# Patient Record
Sex: Female | Born: 1995 | Race: White | Hispanic: No | Marital: Single
Health system: Southern US, Community
[De-identification: ages and names within clinical notes are randomized; demographics above are authoritative.]

---

## 2018-02-01 ENCOUNTER — Emergency Department (HOSPITAL_COMMUNITY)
Admission: EM | Admit: 2018-02-01 | Discharge: 2018-02-01 | Disposition: A | Discharge status: Home / Self Care | Payer: Managed Care, Other (non HMO) | Attending: Emergency Medicine | Admitting: Emergency Medicine

## 2018-02-01 ENCOUNTER — Emergency Department (HOSPITAL_COMMUNITY): Payer: Managed Care, Other (non HMO)

## 2018-02-01 ENCOUNTER — Other Ambulatory Visit: Payer: Self-pay

## 2018-02-01 DIAGNOSIS — N760 Acute vaginitis: Secondary | ICD-10-CM | POA: Insufficient documentation

## 2018-02-01 DIAGNOSIS — N939 Abnormal uterine and vaginal bleeding, unspecified: Secondary | ICD-10-CM

## 2018-02-01 DIAGNOSIS — R102 Pelvic and perineal pain: Secondary | ICD-10-CM

## 2018-02-01 DIAGNOSIS — B9689 Other specified bacterial agents as the cause of diseases classified elsewhere: Secondary | ICD-10-CM

## 2018-02-01 LAB — URINALYSIS, ROUTINE W REFLEX MICROSCOPIC
Bilirubin Urine: NEGATIVE
GLUCOSE, UA: NEGATIVE mg/dL
HGB URINE DIPSTICK: NEGATIVE
Ketones, ur: NEGATIVE mg/dL
Leukocytes, UA: NEGATIVE
Nitrite: NEGATIVE
PROTEIN: NEGATIVE mg/dL
Specific Gravity, Urine: 1.023 (ref 1.005–1.030)
pH: 5 (ref 5.0–8.0)

## 2018-02-01 LAB — I-STAT BETA HCG BLOOD, ED (MC, WL, AP ONLY): I-stat hCG, quantitative: <5 m[IU]/mL (ref <5)

## 2018-02-01 LAB — GC/CHLAMYDIA PROBE AMP (~~LOC~~) NOT AT ARMC
Chlamydia: NEGATIVE
NEISSERIA GONORRHEA: NEGATIVE

## 2018-02-01 LAB — WET PREP, GENITAL
SPERM: NONE SEEN
Trich, Wet Prep: NONE SEEN
Yeast Wet Prep HPF POC: NONE SEEN

## 2018-02-01 MED ORDER — METRONIDAZOLE 500 MG PO TABS: 500.0000 mg | ORAL_TABLET | Freq: Two times a day (BID) | ORAL | 0 refills | Status: AC

## 2018-02-01 NOTE — ED Triage Notes (Signed)
Patient with vaginal bleeding during sex.  She is having pain during sex and not during sex.  She states that she is visiting, is from [place] and she called her GYN who said to come here and get an ultrasound.  Patient with history of endometriosis.

## 2018-02-01 NOTE — Discharge Instructions (Addendum)
Take the prescribed medication as directed.  Do NOT drink alcohol while taking this medication, it will make you sick. Follow-up with the women's clinic if you have ongoing issues. Return to the ED for new or worsening symptoms.

## 2018-02-01 NOTE — ED Provider Notes (Signed)
[facility] EMERGENCY DEPARTMENT Provider Note   CSN: [id number] Arrival date & time: 02/01/18  0141     History   Chief Complaint Chief Complaint  Patient presents with  . Vaginal Bleeding    HPI Sara Hicks is a 22 y.o. female.  The history is provided by the patient and medical records.     22 year old female presenting to the ED with painful intercourse and vaginal bleeding.  States this is been an ongoing issue for the past several months.  She is currently in [place] living with her boyfriend for a few months, her OB/GYN is in [place].  States any time she has intercourse she has a lot of pain all the way across her pelvis and has some vaginal spotting.  States usually this resolves after a while.  States pain has become more intense and bleeding is lasting longer.  She has not had any new discharge.  She has chronic UTI symptoms, has been followed by urology in the past.  She does have history of vesicular reflux in childhood.  She does take ciprofloxacin after intercourse to help prevent UTIs.  States she has never had a Pap smear.  States today she had intercourse with her boyfriend and pain got significantly more intense.  She called her OB/GYN and told her to come in for a ultrasound.  She does have family history of endometriosis but has never had evaluation for this.  No past medical history on file.  There are no active problems to display for this patient.   OB History   None      Home Medications    Prior to Admission medications   Not on File    Family History No family history on file.  Social History Social History   Tobacco Use  . Smoking status: Not on file  Substance Use Topics  . Alcohol use: Not on file  . Drug use: Not on file     Allergies   Nitrofuran derivatives   Review of Systems Review of Systems  Genitourinary: Positive for vaginal bleeding and vaginal pain.  All other systems  reviewed and are negative.    Physical Exam Updated Vital Signs BP 114/82   Pulse 91   Temp 99.5 F (37.5 C) (Oral)   Resp 16   SpO2 97%   Physical Exam  Constitutional: She is oriented to person, place, and time. She appears well-developed and well-nourished.  HENT:  Head: Normocephalic and atraumatic.  Mouth/Throat: Oropharynx is clear and moist.  Eyes: Pupils are equal, round, and reactive to light. Conjunctivae and EOM are normal.  Neck: Normal range of motion.  Cardiovascular: Normal rate, regular rhythm and normal heart sounds.  Pulmonary/Chest: Effort normal and breath sounds normal. No stridor. No respiratory distress.  Abdominal: Soft. Bowel sounds are normal. There is no tenderness. There is no rebound.  Genitourinary:  Genitourinary Comments: Exam chaperoned by RN Normal female external genitalia without visible lesions or rash; mild amount of thin, white mucous appearing discharge; some mild vaginal wall irritation noted without bleeding; no lesions noted; no adnexal or CMT  Musculoskeletal: Normal range of motion.  Neurological: She is alert and oriented to person, place, and time.  Skin: Skin is warm and dry.  Psychiatric: She has a normal mood and affect.  Nursing note and vitals reviewed.    ED Treatments / Results  Labs (all labs ordered are listed, but only abnormal results are displayed) Labs Reviewed  WET  PREP, GENITAL - Abnormal; Notable for the following components:      Result Value   Clue Cells Wet Prep HPF POC PRESENT (*)    WBC, Wet Prep HPF POC MODERATE (*)    All other components within normal limits  URINALYSIS, ROUTINE W REFLEX MICROSCOPIC - Abnormal; Notable for the following components:   APPearance HAZY (*)    All other components within normal limits  URINE CULTURE  I-STAT BETA HCG BLOOD, ED ([facility], [facility], [facility] ONLY)  GC/CHLAMYDIA PROBE AMP ([place]) NOT AT [facility]    EKG None  Radiology [place] Pelvis Transvanginal Non-ob (tv  Only)  Result Date: 02/01/2018 CLINICAL DATA:  22 year old female with pelvic vein. EXAM: TRANSABDOMINAL AND TRANSVAGINAL ULTRASOUND OF PELVIS DOPPLER ULTRASOUND OF OVARIES TECHNIQUE: Both transabdominal and transvaginal ultrasound examinations of the pelvis were performed. Transabdominal technique was performed for global imaging of the pelvis including uterus, ovaries, adnexal regions, and pelvic cul-de-sac. It was necessary to proceed with endovaginal exam following the transabdominal exam to visualize the endometrium and ovaries. Color and duplex Doppler ultrasound was utilized to evaluate blood flow to the ovaries. COMPARISON:  None. FINDINGS: Uterus Measurements: 5.6 x 3.8 x 5.1 cm. No fibroids or other mass visualized. Endometrium Thickness: 6 mm.  No focal abnormality visualized. Right ovary Measurements: 3.3 x 2.1 x 1.9 cm. Normal appearance/no adnexal mass. Left ovary Measurements: 2.8 x 1.9 x 2.2 cm. Normal appearance/no adnexal mass. Pulsed Doppler evaluation of both ovaries demonstrates normal low-resistance arterial and venous waveforms. Other findings Small free fluid within pelvis. IMPRESSION: Unremarkable pelvic ultrasound. Doppler detected arterial and venous flow to both ovaries. Electronically Signed   By: Elgie CollardArash  Radparvar M.D.   On: 02/01/2018 05:53   [place] Pelvis Complete  Result Date: 02/01/2018 CLINICAL DATA:  22 year old female with pelvic vein. EXAM: TRANSABDOMINAL AND TRANSVAGINAL ULTRASOUND OF PELVIS DOPPLER ULTRASOUND OF OVARIES TECHNIQUE: Both transabdominal and transvaginal ultrasound examinations of the pelvis were performed. Transabdominal technique was performed for global imaging of the pelvis including uterus, ovaries, adnexal regions, and pelvic cul-de-sac. It was necessary to proceed with endovaginal exam following the transabdominal exam to visualize the endometrium and ovaries. Color and duplex Doppler ultrasound was utilized to evaluate blood flow to the ovaries.  COMPARISON:  None. FINDINGS: Uterus Measurements: 5.6 x 3.8 x 5.1 cm. No fibroids or other mass visualized. Endometrium Thickness: 6 mm.  No focal abnormality visualized. Right ovary Measurements: 3.3 x 2.1 x 1.9 cm. Normal appearance/no adnexal mass. Left ovary Measurements: 2.8 x 1.9 x 2.2 cm. Normal appearance/no adnexal mass. Pulsed Doppler evaluation of both ovaries demonstrates normal low-resistance arterial and venous waveforms. Other findings Small free fluid within pelvis. IMPRESSION: Unremarkable pelvic ultrasound. Doppler detected arterial and venous flow to both ovaries. Electronically Signed   By: Elgie CollardArash  Radparvar M.D.   On: 02/01/2018 05:53   [place] Pelvic Doppler (torsion R/o Or Mass Arterial Flow)  Result Date: 02/01/2018 CLINICAL DATA:  22 year old female with pelvic vein. EXAM: TRANSABDOMINAL AND TRANSVAGINAL ULTRASOUND OF PELVIS DOPPLER ULTRASOUND OF OVARIES TECHNIQUE: Both transabdominal and transvaginal ultrasound examinations of the pelvis were performed. Transabdominal technique was performed for global imaging of the pelvis including uterus, ovaries, adnexal regions, and pelvic cul-de-sac. It was necessary to proceed with endovaginal exam following the transabdominal exam to visualize the endometrium and ovaries. Color and duplex Doppler ultrasound was utilized to evaluate blood flow to the ovaries. COMPARISON:  None. FINDINGS: Uterus Measurements: 5.6 x 3.8 x 5.1 cm. No fibroids or other mass visualized. Endometrium Thickness:  6 mm.  No focal abnormality visualized. Right ovary Measurements: 3.3 x 2.1 x 1.9 cm. Normal appearance/no adnexal mass. Left ovary Measurements: 2.8 x 1.9 x 2.2 cm. Normal appearance/no adnexal mass. Pulsed Doppler evaluation of both ovaries demonstrates normal low-resistance arterial and venous waveforms. Other findings Small free fluid within pelvis. IMPRESSION: Unremarkable pelvic ultrasound. Doppler detected arterial and venous flow to both ovaries. Electronically  Signed   By: Elgie Collard M.D.   On: 02/01/2018 05:53    Procedures Procedures (including critical care time)  Medications Ordered in ED Medications - No data to display   Initial Impression / Assessment and Plan / ED Course  I have reviewed the triage vital signs and the nursing notes.  Pertinent labs & imaging results that were available during my care of the patient were reviewed by me and considered in my medical decision making (see chart for details).  22 year old female presenting to the ED with pelvic pain and vaginal spotting during intercourse.  This is been ongoing for a few months now.  She is in [place] visiting her boyfriend for a few months, OB/GYN that she sees in [place].  Was told to come in today for ultrasound.  She is afebrile and nontoxic.  Her abdomen is soft and benign.  Pelvic exam performed, some mucousy appearing discharge and vaginal wall appears irritated but there is no active bleeding.  No visible lesions or rash.  No adnexal or cervical motion tenderness.  Ultrasound was obtained and is negative for any acute findings.  Wet prep does reveal clue cells.  Gonorrhea and Chlamydia cultures pending.  Patient will be started on Flagyl-- advised to avoid EtOH while taking this.  She was given follow-up with the women's clinic if any ongoing issues while here in [place], otherwise understands to follow-up with her OB/GYN once returning home.  Discussed plan with patient, she acknowledged understanding and agreed with plan of care.  Return precautions given for new or worsening symptoms.  Final Clinical Impressions(s) / ED Diagnoses   Final diagnoses:  Pelvic pain  Vaginal spotting  BV (bacterial vaginosis)    ED Discharge Orders        Ordered    metroNIDAZOLE (FLAGYL) 500 MG tablet  2 times daily     02/01/18 0647       Garlon Hatchet, PA-C 02/01/18 2956    Gilda Crease, MD 02/01/18 [phone]

## 2018-02-01 NOTE — ED Notes (Signed)
ED Provider at bedside. 

## 2018-02-01 NOTE — ED Notes (Signed)
Discharge instructions/prescriptions reviewed with patient. Signature pad unavailable at this time. Pt verbalized understanding.

## 2018-02-03 LAB — URINE CULTURE

## 2018-02-04 ENCOUNTER — Telehealth (HOSPITAL_COMMUNITY): Payer: Self-pay | Admitting: Pharmacist

## 2018-02-04 NOTE — Progress Notes (Signed)
ED Antimicrobial Stewardship Positive Culture Follow Up   Sara Hicks is an 22 y.o. female who presented to [facility]   Recent Results (from the past 720 hour(s))  Wet prep, genital     Status: Abnormal   Collection Time: 02/01/18  3:57 AM  Result Value Ref Range Status   Yeast Wet Prep HPF POC NONE SEEN NONE SEEN Final   Trich, Wet Prep NONE SEEN NONE SEEN Final   Clue Cells Wet Prep HPF POC PRESENT (A) NONE SEEN Final   WBC, Wet Prep HPF POC MODERATE (A) NONE SEEN Final   Sperm NONE SEEN  Final    Comment: Performed at [facility], [address] [place], [place] [address]  Urine culture     Status: Abnormal   Collection Time: 02/01/18  3:57 AM  Result Value Ref Range Status   Specimen Description URINE, CLEAN CATCH  Final   Special Requests   Final    NONE Performed at [facility], [address] [place], [place] [address]    Culture >=100,000 COLONIES/mL ENTEROCOCCUS FAECALIS (A)  Final   Report Status 02/03/2018 FINAL  Final   Organism ID, Bacteria ENTEROCOCCUS FAECALIS (A)  Final      Susceptibility   Enterococcus faecalis - MIC*    AMPICILLIN <=2 SENSITIVE Sensitive     LEVOFLOXACIN >=8 RESISTANT Resistant     NITROFURANTOIN <=16 SENSITIVE Sensitive     VANCOMYCIN 1 SENSITIVE Sensitive     * >=100,000 COLONIES/mL ENTEROCOCCUS FAECALIS    []  Treated with, organism resistant to prescribed antimicrobial [x]  Patient discharged originally without antimicrobial agent and treatment is now indicated  New antibiotic prescription: Amoxicillin 500 mg po tid x 7days  ED Provider: K. Ala DachFord, PA-C   MastersJill Side, Emilia Kayes M 02/04/2018, 9:27 AM

## 2018-02-04 NOTE — Telephone Encounter (Signed)
Post ED Visit - Positive Culture Follow-up: Successful Patient Follow-Up  Culture assessed and recommendations reviewed by: []  Enzo BiNathan Batchelder, Pharm.D. []  Celedonio MiyamotoJeremy Frens, [address] BCPS AQ-ID []  Garvin FilaMike Maccia, Pharm.D., BCPS []  Georgina PillionElizabeth Martin, Pharm.D., BCPS []  MiloMinh Pham, [address] BCPS, AAHIVP []  Estella HuskMichelle Turner, Pharm.D., BCPS, AAHIVP []  Lysle Pearlachel Rumbarger, PharmD, BCPS []  Sherlynn CarbonAustin Lucas, PharmD []  Pollyann SamplesAndy Johnston, PharmD, BCPS A. Masters Pharm D Positive urine culture  [x]  Patient discharged without antimicrobial prescription and treatment is now indicated []  Organism is resistant to prescribed ED discharge antimicrobial []  Patient with positive blood cultures  Changes discussed with ED provider: Boykin ReaperKelsely Ford [employer] New antibiotic prescription Amoxicillin 500 PO TIC x 7 days [facility] [address] [phone]  Contacted patient, date 02/04/18, time 0950   Jerry CarasCullom, Ernestene Coover Burnett 02/04/2018, 9:49 AM

## 2019-01-03 IMAGING — US US TRANSVAGINAL NON-OB
1 series · 13 of 25 positions shown · non-contrast
Comparison: None.

CLINICAL DATA: 21-year-old female with pelvic vein.

EXAM:
TRANSABDOMINAL AND TRANSVAGINAL ULTRASOUND OF PELVIS
DOPPLER ULTRASOUND OF OVARIES
TECHNIQUE: Both transabdominal and transvaginal ultrasound examinations of the
pelvis were performed. Transabdominal technique was performed for
global imaging of the pelvis including uterus, ovaries, adnexal
regions, and pelvic cul-de-sac.
It was necessary to proceed with endovaginal exam following the
transabdominal exam to visualize the endometrium and ovaries. Color
and duplex Doppler ultrasound was utilized to evaluate blood flow to
the ovaries.

[Series 1: us transvaginal non-ob · 0.24mm/px · 60 acquisitions, 13 frames shown]
[im 1/60]
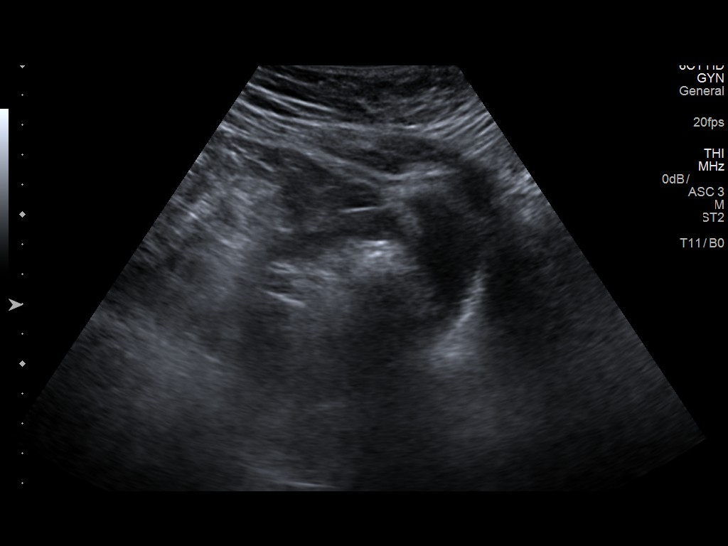
[im 5/60]
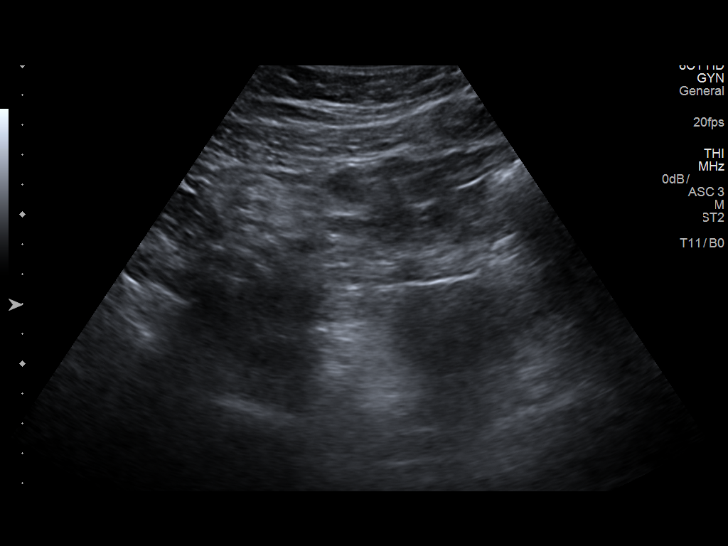
[im 10/60]
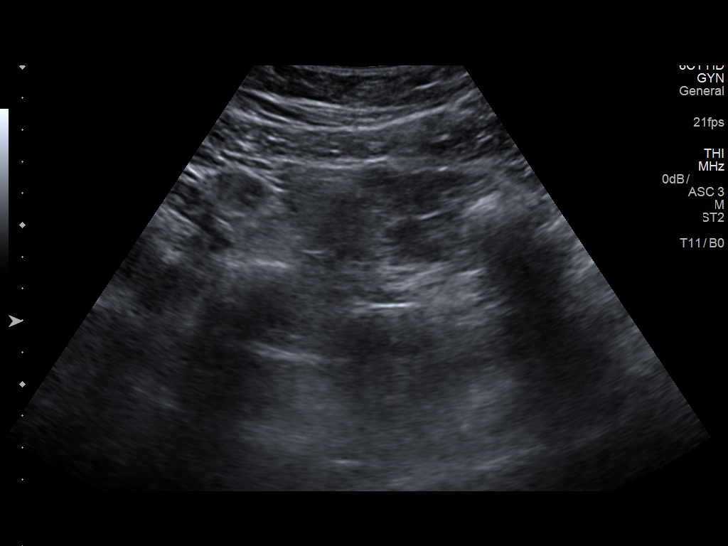
[im 15/60]
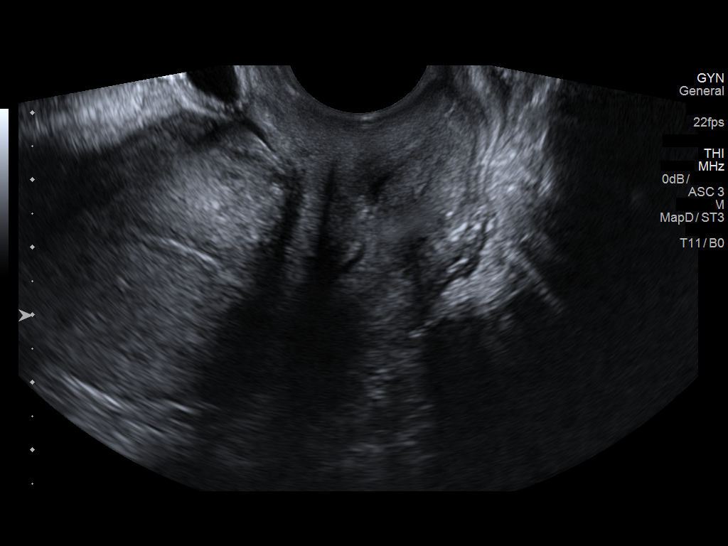
[im 20/60]
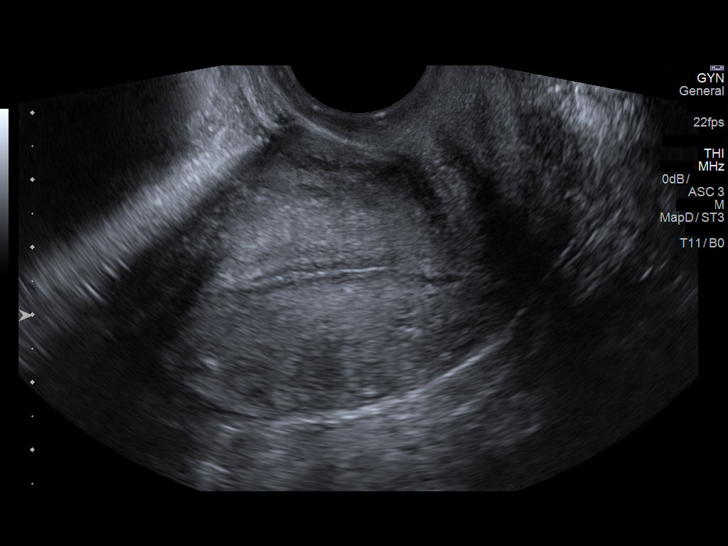
[im 25/60]
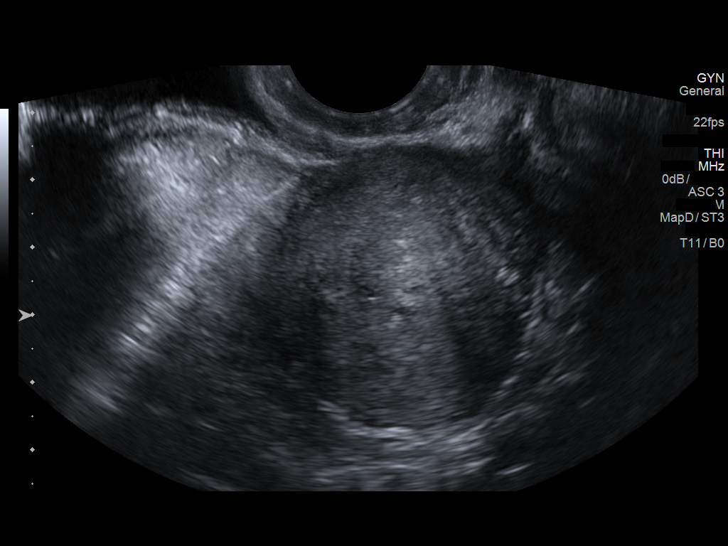
[im 30/60]
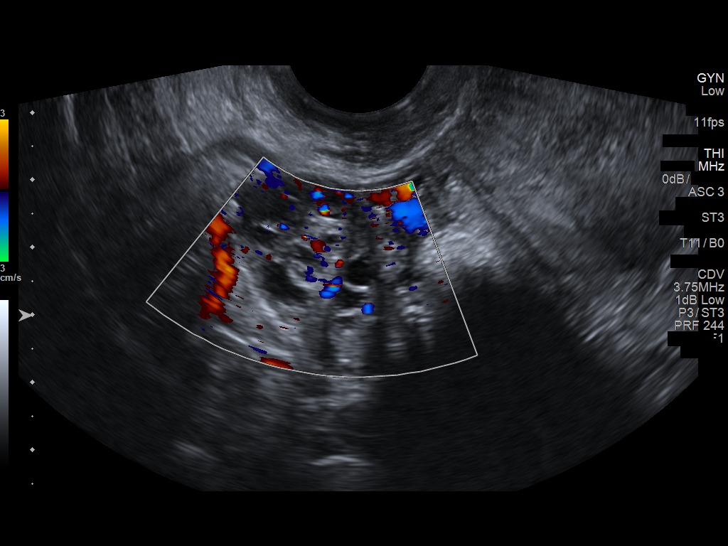
[im 35/60]
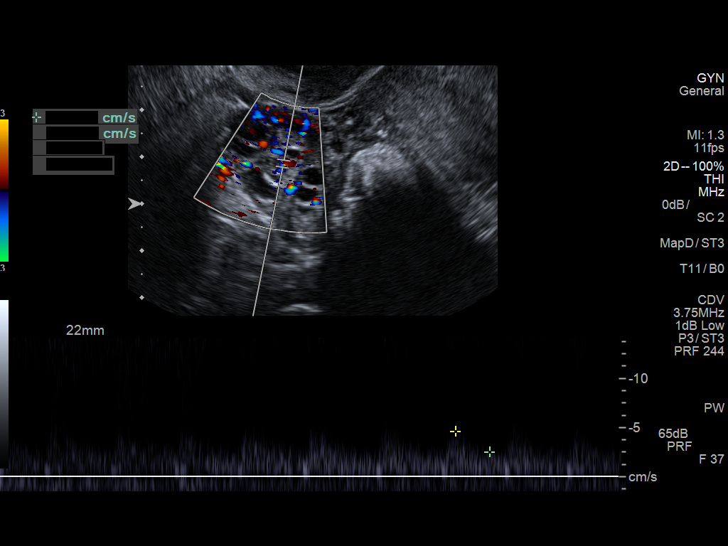
[im 40/60]
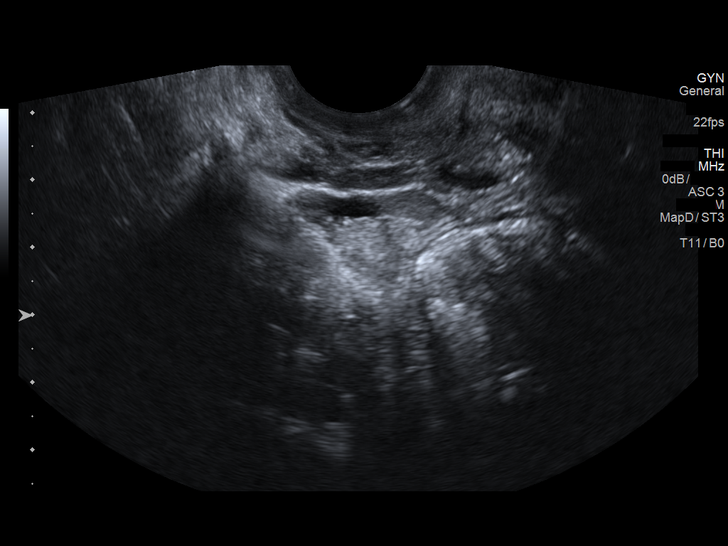
[im 45/60]
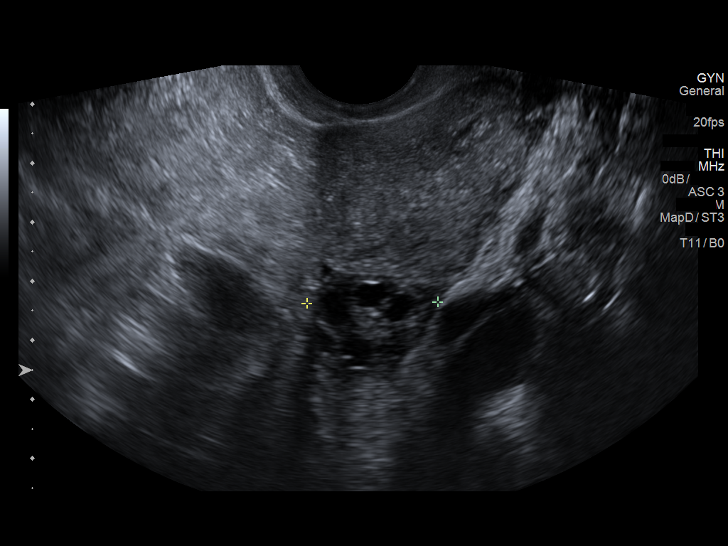
[im 50/60]
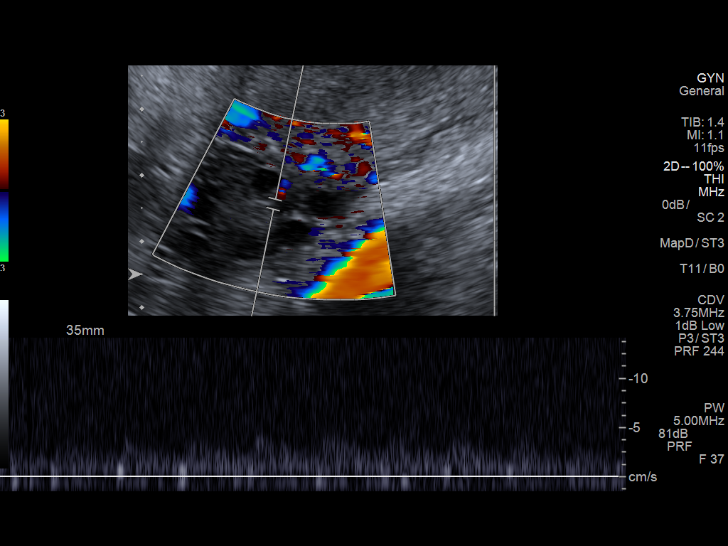
[im 55/60]
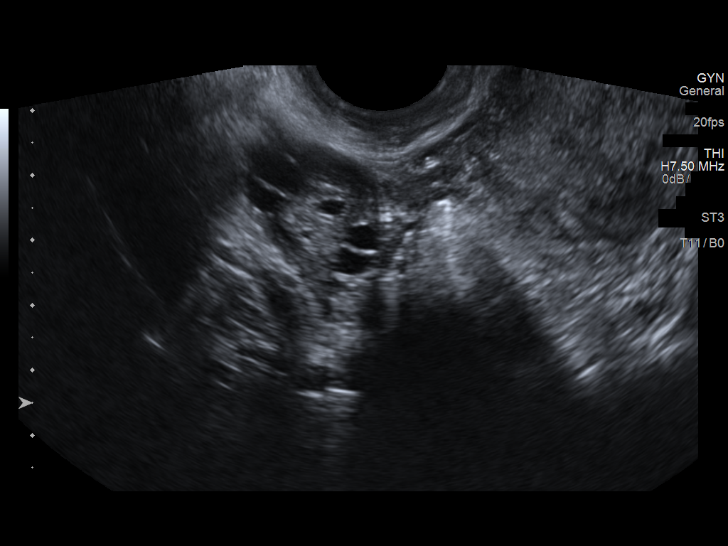
[im 60/60]
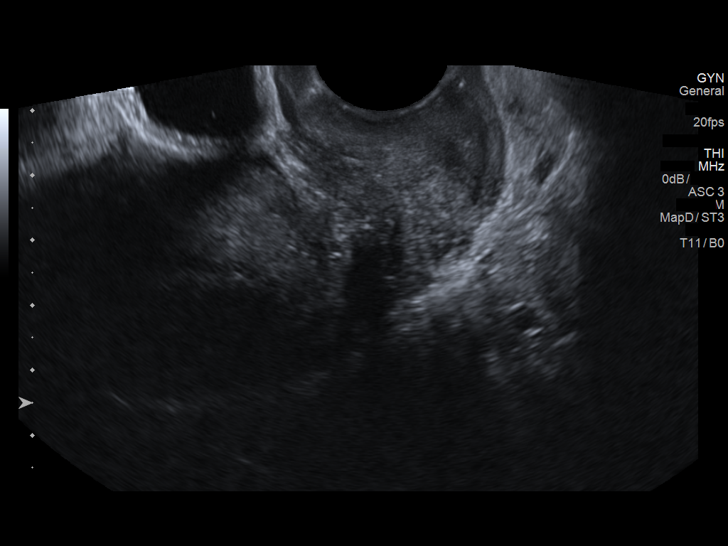

[13 of 25 positions shown; findings below may reference images not displayed]

FINDINGS: Uterus

Measurements: 5.6 x 3.8 x 5.1 cm. No fibroids or other mass
visualized.

Endometrium

Thickness: 6 mm.  No focal abnormality visualized.

Right ovary

Measurements: 3.3 x 2.1 x 1.9 cm. Normal appearance/no adnexal mass.

Left ovary

Measurements: 2.8 x 1.9 x 2.2 cm. Normal appearance/no adnexal mass.

Pulsed Doppler evaluation of both ovaries demonstrates normal
low-resistance arterial and venous waveforms.

Other findings

Small free fluid within pelvis.
IMPRESSION: Unremarkable pelvic ultrasound. Doppler detected arterial and venous
flow to both ovaries.

## 2020-06-15 ENCOUNTER — Encounter (HOSPITAL_COMMUNITY): Payer: Self-pay

## 2020-06-15 ENCOUNTER — Other Ambulatory Visit: Payer: Self-pay

## 2020-06-15 ENCOUNTER — Emergency Department (HOSPITAL_COMMUNITY)
Admission: EM | Admit: 2020-06-15 | Discharge: 2020-06-15 | Disposition: A | Discharge status: Home / Self Care | Payer: Managed Care, Other (non HMO) | Attending: Emergency Medicine | Admitting: Emergency Medicine

## 2020-06-15 DIAGNOSIS — R300 Dysuria: Secondary | ICD-10-CM | POA: Insufficient documentation

## 2020-06-15 DIAGNOSIS — N12 Tubulo-interstitial nephritis, not specified as acute or chronic: Secondary | ICD-10-CM

## 2020-06-15 DIAGNOSIS — R509 Fever, unspecified: Secondary | ICD-10-CM | POA: Diagnosis not present

## 2020-06-15 DIAGNOSIS — R350 Frequency of micturition: Secondary | ICD-10-CM | POA: Insufficient documentation

## 2020-06-15 DIAGNOSIS — R030 Elevated blood-pressure reading, without diagnosis of hypertension: Secondary | ICD-10-CM

## 2020-06-15 DIAGNOSIS — R1030 Lower abdominal pain, unspecified: Secondary | ICD-10-CM | POA: Insufficient documentation

## 2020-06-15 DIAGNOSIS — N390 Urinary tract infection, site not specified: Secondary | ICD-10-CM

## 2020-06-15 LAB — COMPREHENSIVE METABOLIC PANEL
ALT: 27 U/L (ref 0–44)
AST: 25 U/L (ref 15–41)
Albumin: 3.6 g/dL (ref 3.5–5.0)
Alkaline Phosphatase: 81 U/L (ref 38–126)
Anion gap: 13 (ref 5–15)
BUN: 13 mg/dL (ref 6–20)
CO2: 21 mmol/L — ABNORMAL LOW (ref 22–32)
Calcium: 9.4 mg/dL (ref 8.9–10.3)
Chloride: 102 mmol/L (ref 98–111)
Creatinine, Ser: 0.7 mg/dL (ref 0.44–1.00)
GFR, Estimated: >60 mL/min (ref >60)
Glucose, Bld: 95 mg/dL (ref 70–99)
Potassium: 3.7 mmol/L (ref 3.5–5.1)
Sodium: 136 mmol/L (ref 135–145)
Total Bilirubin: 0.3 mg/dL (ref 0.3–1.2)
Total Protein: 7.1 g/dL (ref 6.5–8.1)

## 2020-06-15 LAB — CBC
HCT: 41.3 % (ref 36.0–46.0)
Hemoglobin: 13.2 g/dL (ref 12.0–15.0)
MCH: 26.9 pg (ref 26.0–34.0)
MCHC: 32 g/dL (ref 30.0–36.0)
MCV: 84.1 fL (ref 80.0–100.0)
Platelets: 375 10*3/uL (ref 150–400)
RBC: 4.91 MIL/uL (ref 3.87–5.11)
RDW: 13.7 % (ref 11.5–15.5)
WBC: 7.9 10*3/uL (ref 4.0–10.5)
nRBC: 0 % (ref 0.0–0.2)

## 2020-06-15 LAB — URINALYSIS, ROUTINE W REFLEX MICROSCOPIC
Bilirubin Urine: NEGATIVE
Glucose, UA: NEGATIVE mg/dL
Ketones, ur: NEGATIVE mg/dL
Nitrite: NEGATIVE
Protein, ur: 30 mg/dL — AB
Specific Gravity, Urine: 1.013 (ref 1.005–1.030)
WBC, UA: >50 WBC/hpf — ABNORMAL HIGH (ref 0–5)
pH: 5 (ref 5.0–8.0)

## 2020-06-15 LAB — LIPASE, BLOOD: Lipase: 32 U/L (ref 11–51)

## 2020-06-15 LAB — I-STAT BETA HCG BLOOD, ED (MC, WL, AP ONLY): I-stat hCG, quantitative: <5 m[IU]/mL (ref <5)

## 2020-06-15 MED ORDER — CEFDINIR 300 MG PO CAPS: 300.0000 mg | ORAL_CAPSULE | Freq: Two times a day (BID) | ORAL | 0 refills | Status: AC

## 2020-06-15 MED ORDER — CEFTRIAXONE SODIUM 1 G IJ SOLR
1.0000 g | Freq: Once | INTRAMUSCULAR | Status: AC
  Administered 2020-06-15: 1 g via INTRAMUSCULAR
  Filled 2020-06-15: qty 10

## 2020-06-15 MED ORDER — LIDOCAINE HCL (PF) 1 % IJ SOLN
INTRAMUSCULAR | Status: AC
  Filled 2020-06-15: qty 5

## 2020-06-15 MED ORDER — ONDANSETRON 4 MG PO TBDP
8.0000 mg | ORAL_TABLET | Freq: Once | ORAL | Status: AC
  Administered 2020-06-15: 8 mg via ORAL
  Filled 2020-06-15: qty 2

## 2020-06-15 MED ORDER — ACETAMINOPHEN 500 MG PO TABS
1000.0000 mg | ORAL_TABLET | Freq: Once | ORAL | Status: AC
  Administered 2020-06-15: 1000 mg via ORAL
  Filled 2020-06-15: qty 2

## 2020-06-15 NOTE — Discharge Instructions (Addendum)
It was our pleasure to provide your ER care today - we hope that you feel better.  Rest. Drink plenty of fluids.   Take acetaminophen or ibuprofen as need.   Take antibiotic (omnicef) as prescribed.   Follow up with primary care doctor in the next 2-3 days if symptoms fail to improve/resolve.  Your blood pressure is high today - follow up with primary care doctor in 1-2 weeks.   Return to ER if worse, new symptoms, worsening or severe pain, persistent vomiting, weak/fainting, or other concern.

## 2020-06-15 NOTE — ED Triage Notes (Signed)
Pt states that she has been having urinary symptoms over the past week, dysuria, and odor, pt also having lower abd pain and some diarrhea and low grade fevers.

## 2020-06-15 NOTE — ED Provider Notes (Signed)
[facility] EMERGENCY DEPARTMENT Provider Note   CSN: [id number] Arrival date & time: 06/15/20  0459     History Chief Complaint  Patient presents with  . Abdominal Pain    Linlee Lavalais is a 24 y.o. female.  Patient with hx utis, c/o urinary urgency, dysuria, and suprapubic pain for the past week. Symptoms acute onset, moderate, constant, persistent. In past day, gradual onset  left flank pain posteriorly, dull, moderate, non radiating. Pt notes hx uncomplicated uti. No hx kidney stones. Patient indicates low grade fever at home, currently afebrile.  Mild nausea. No vomiting. Having normal bms. No abd distension. No faintness or dizziness.   The history is provided by the patient.  Abdominal Pain Associated symptoms: dysuria and fever   Associated symptoms: no chest pain, no cough, no shortness of breath, no sore throat, no vaginal bleeding, no vaginal discharge and no vomiting        History reviewed. No pertinent past medical history.  There are no problems to display for this patient.   History reviewed. No pertinent surgical history.   OB History   No obstetric history on file.     No family history on file.  Social History   Tobacco Use  . Smoking status: Not on file  Substance Use Topics  . Alcohol use: Not on file  . Drug use: Not on file    Home Medications Prior to Admission medications   Medication Sig Start Date End Date Taking? Authorizing Provider  metroNIDAZOLE (FLAGYL) 500 MG tablet Take 1 tablet (500 mg total) by mouth 2 (two) times daily. 02/01/18   Garlon Hatchet, PA-C    Allergies    Nitrofuran derivatives  Review of Systems   Review of Systems  Constitutional: Positive for fever.  HENT: Negative for sore throat.   Eyes: Negative for redness.  Respiratory: Negative for cough and shortness of breath.   Cardiovascular: Negative for chest pain.  Gastrointestinal: Positive for abdominal pain. Negative for abdominal  distention and vomiting.  Endocrine: Negative for polyuria.  Genitourinary: Positive for dysuria and urgency. Negative for flank pain, vaginal bleeding and vaginal discharge.  Musculoskeletal: Negative for neck pain.  Skin: Negative for rash.  Neurological: Negative for headaches.  Hematological: Does not bruise/bleed easily.  Psychiatric/Behavioral: Negative for confusion.    Physical Exam Updated Vital Signs BP (!) 129/104   Pulse 91   Temp 98.1 F (36.7 C) (Oral)   Resp 20   Ht 1.549 m (5\' 1" )   Wt 102.1 kg   SpO2 96%   BMI 42.51 kg/m   Physical Exam Vitals and nursing note reviewed.  Constitutional:      Appearance: Normal appearance. She is well-developed.  HENT:     Head: Atraumatic.     Nose: Nose normal.     Mouth/Throat:     Mouth: Mucous membranes are moist.  Eyes:     General: No scleral icterus.    Conjunctiva/sclera: Conjunctivae normal.  Neck:     Trachea: No tracheal deviation.  Cardiovascular:     Rate and Rhythm: Normal rate and regular rhythm.     Pulses: Normal pulses.     Heart sounds: Normal heart sounds. No murmur heard.  No friction rub. No gallop.   Pulmonary:     Effort: Pulmonary effort is normal. No respiratory distress.     Breath sounds: Normal breath sounds.  Abdominal:     General: Bowel sounds are normal. There is no distension.  Palpations: Abdomen is soft. There is no mass.     Tenderness: There is no abdominal tenderness. There is no guarding or rebound.     Hernia: No hernia is present.  Genitourinary:    Comments: Mild left cva tenderness.  Musculoskeletal:        General: No swelling.     Cervical back: Normal range of motion and neck supple. No rigidity. No muscular tenderness.  Skin:    General: Skin is warm and dry.     Findings: No rash.  Neurological:     Mental Status: She is alert.     Comments: Alert, speech normal. Steady gait.   Psychiatric:        Mood and Affect: Mood normal.     ED Results /  Procedures / Treatments   Labs (all labs ordered are listed, but only abnormal results are displayed) Results for orders placed or performed during the hospital encounter of 06/15/20  Lipase, blood  Result Value Ref Range   Lipase 32 11 - 51 U/L  Comprehensive metabolic panel  Result Value Ref Range   Sodium 136 135 - 145 mmol/L   Potassium 3.7 3.5 - 5.1 mmol/L   Chloride 102 98 - 111 mmol/L   CO2 21 (L) 22 - 32 mmol/L   Glucose, Bld 95 70 - 99 mg/dL   BUN 13 6 - 20 mg/dL   Creatinine, Ser 3.79 0.44 - 1.00 mg/dL   Calcium 9.4 8.9 - 02.4 mg/dL   Total Protein 7.1 6.5 - 8.1 g/dL   Albumin 3.6 3.5 - 5.0 g/dL   AST 25 15 - 41 U/L   ALT 27 0 - 44 U/L   Alkaline Phosphatase 81 38 - 126 U/L   Total Bilirubin 0.3 0.3 - 1.2 mg/dL   GFR, Estimated >09 >73 mL/min   Anion gap 13 5 - 15  CBC  Result Value Ref Range   WBC 7.9 4.0 - 10.5 K/uL   RBC 4.91 3.87 - 5.11 MIL/uL   Hemoglobin 13.2 12.0 - 15.0 g/dL   HCT 53.2 36 - 46 %   MCV 84.1 80.0 - 100.0 fL   MCH 26.9 26.0 - 34.0 pg   MCHC 32.0 30.0 - 36.0 g/dL   RDW 99.2 42.6 - 83.4 %   Platelets 375 150 - 400 K/uL   nRBC 0.0 0.0 - 0.2 %  Urinalysis, Routine w reflex microscopic Urine, Clean Catch  Result Value Ref Range   Color, Urine YELLOW YELLOW   APPearance CLOUDY (A) CLEAR   Specific Gravity, Urine 1.013 1.005 - 1.030   pH 5.0 5.0 - 8.0   Glucose, UA NEGATIVE NEGATIVE mg/dL   Hgb urine dipstick MODERATE (A) NEGATIVE   Bilirubin Urine NEGATIVE NEGATIVE   Ketones, ur NEGATIVE NEGATIVE mg/dL   Protein, ur 30 (A) NEGATIVE mg/dL   Nitrite NEGATIVE NEGATIVE   Leukocytes,Ua LARGE (A) NEGATIVE   RBC / HPF 6-10 0 - 5 RBC/hpf   WBC, UA >50 (H) 0 - 5 WBC/hpf   Bacteria, UA FEW (A) NONE SEEN   Squamous Epithelial / LPF 0-5 0 - 5   WBC Clumps PRESENT    Mucus PRESENT   I-Stat beta hCG blood, ED  Result Value Ref Range   I-stat hCG, quantitative <5.0 <5 mIU/mL   Comment 3            EKG None  Radiology No results  found.  Procedures Procedures (including critical care time)  Medications Ordered in  ED Medications  lidocaine (PF) (XYLOCAINE) 1 % injection (has no administration in time range)  cefTRIAXone (ROCEPHIN) injection 1 g (1 g Intramuscular Given 06/15/20 0718)  acetaminophen (TYLENOL) tablet 1,000 mg (1,000 mg Oral Given 06/15/20 0718)  ondansetron (ZOFRAN-ODT) disintegrating tablet 8 mg (8 mg Oral Given 06/15/20 0223)    ED Course  I have reviewed the triage vital signs and the nursing notes.  Pertinent labs & imaging results that were available during my care of the patient were reviewed by me and considered in my medical decision making (see chart for details).    MDM Rules/Calculators/A&P                         Labs sent.  Reviewed nursing notes and prior charts for additional history.   Confirmed w pt only allergy is nitrofurantoin.  Labs reviewed/interpreteted by me - wbc normal. UA c/w uti.   Rocephin 1 gm im.   zofran po. Acetaminophen po. Po fluids.   Pt is tolerating po. Appears comfortable and in no acute distress. No emesis.   Abd soft nt.   Pt currently appears stable for d/c.  Abx rx for home.  Return precautions provided.    Final Clinical Impression(s) / ED Diagnoses Final diagnoses:  None    Rx / DC Orders ED Discharge Orders    None       Cathren Laine, MD 06/16/20 1010

## 2020-06-15 NOTE — ED Notes (Signed)
Pt verbalized understanding of discharge paperwork, prescription and follow-up care.
